# Patient Record
Sex: Male | Born: 1970 | Race: Black or African American | Hispanic: No | Marital: Married | Smoking: Former smoker
Health system: Southern US, Community
[De-identification: ages and names within clinical notes are randomized; demographics above are authoritative.]

## PROBLEM LIST (undated history)

## (undated) DIAGNOSIS — I10 Essential (primary) hypertension: Secondary | ICD-10-CM

## (undated) DIAGNOSIS — E7800 Pure hypercholesterolemia, unspecified: Secondary | ICD-10-CM

---

## 2016-03-12 ENCOUNTER — Emergency Department (HOSPITAL_COMMUNITY)
Admission: EM | Admit: 2016-03-12 | Discharge: 2016-03-12 | Disposition: A | Discharge status: Home / Self Care | Payer: 59 | Attending: Emergency Medicine | Admitting: Emergency Medicine

## 2016-03-12 ENCOUNTER — Emergency Department (HOSPITAL_COMMUNITY): Payer: 59

## 2016-03-12 ENCOUNTER — Other Ambulatory Visit: Payer: Self-pay

## 2016-03-12 ENCOUNTER — Encounter (HOSPITAL_COMMUNITY): Payer: Self-pay

## 2016-03-12 DIAGNOSIS — I2510 Atherosclerotic heart disease of native coronary artery without angina pectoris: Secondary | ICD-10-CM | POA: Diagnosis not present

## 2016-03-12 DIAGNOSIS — Z79899 Other long term (current) drug therapy: Secondary | ICD-10-CM | POA: Diagnosis not present

## 2016-03-12 DIAGNOSIS — R0789 Other chest pain: Secondary | ICD-10-CM

## 2016-03-12 DIAGNOSIS — R079 Chest pain, unspecified: Secondary | ICD-10-CM | POA: Diagnosis present

## 2016-03-12 DIAGNOSIS — I10 Essential (primary) hypertension: Secondary | ICD-10-CM | POA: Insufficient documentation

## 2016-03-12 DIAGNOSIS — I252 Old myocardial infarction: Secondary | ICD-10-CM | POA: Insufficient documentation

## 2016-03-12 LAB — BASIC METABOLIC PANEL
ANION GAP: 6 (ref 5–15)
BUN: 10 mg/dL (ref 6–20)
CO2: 27 mmol/L (ref 22–32)
Calcium: 9.3 mg/dL (ref 8.9–10.3)
Chloride: 106 mmol/L (ref 101–111)
Creatinine, Ser: 0.97 mg/dL (ref 0.61–1.24)
GLUCOSE: 94 mg/dL (ref 65–99)
POTASSIUM: 4.2 mmol/L (ref 3.5–5.1)
Sodium: 139 mmol/L (ref 135–145)

## 2016-03-12 LAB — CBC WITH DIFFERENTIAL/PLATELET
BASOS ABS: 0 10*3/uL (ref 0.0–0.1)
BASOS PCT: 1 %
EOS PCT: 5 %
Eosinophils Absolute: 0.3 10*3/uL (ref 0.0–0.7)
HEMATOCRIT: 40.7 % (ref 39.0–52.0)
Hemoglobin: 13.3 g/dL (ref 13.0–17.0)
LYMPHS PCT: 41 %
Lymphs Abs: 2.3 10*3/uL (ref 0.7–4.0)
MCH: 29.7 pg (ref 26.0–34.0)
MCHC: 32.7 g/dL (ref 30.0–36.0)
MCV: 90.8 fL (ref 78.0–100.0)
Monocytes Absolute: 0.6 10*3/uL (ref 0.1–1.0)
Monocytes Relative: 10 %
NEUTROS ABS: 2.5 10*3/uL (ref 1.7–7.7)
Neutrophils Relative %: 43 %
PLATELETS: 253 10*3/uL (ref 150–400)
RBC: 4.48 MIL/uL (ref 4.22–5.81)
RDW: 14.3 % (ref 11.5–15.5)
WBC: 5.7 10*3/uL (ref 4.0–10.5)

## 2016-03-12 LAB — I-STAT TROPONIN, ED: TROPONIN I, POC: 0.01 ng/mL (ref 0.00–0.08)

## 2016-03-12 NOTE — ED Triage Notes (Signed)
Per PT, Pt is coming from UC with complaints of Left-Sided Chest pain that started on Tuesday and has been intermittent since then. Denies SOB, N/V/D, or any associated symptoms.

## 2016-03-12 NOTE — ED Provider Notes (Signed)
[facility]-EMER[facility]ENCY DEPT Provider Note   CSN: [id number] Arrival date & time: 03/12/16  1441     History   Chief Complaint Chief Complaint  Patient presents with  . Chest Pain    HPI Timothy DieselJermaine Neyman is a 45 y.o. male who presents the emergency department with chief complaint of chest pain. The patient is [place] from [place]. He is currently sleeping on an air mattress because he is here on a short-term work assignment. Patient states that this past Monday he noticed left-sided chest pain which he describes as sharp, intermittent and nonradiating. He states that it "comes and goes." Nothing seems to make it worse or better. However, it doesn't seem to lighten up. When stretching his arm. In extension. This pain is nonexertional, without associated dyspnea, diaphoresis, nausea. Patient states that he noticed it this morning when he woke up, he took his regular blood pressure and cholesterol medications and went back to bed. He woke up and it was gone. But when he went to shop at [employer]. He noticed the pain again and went to an urgent care who sent him here for further evaluation. He has had a slight cough but denies symptoms of URI. He is a smoker, but states that he quit smoking one week ago. He has a history of hypertension, hyperlipidemia. He has family history on his coronary artery disease with triple bypass and a grandfather who died of MI in his early 2280s. The patient has had a previous workup that includes EKGs, stress test, coronary catheterization and is followed by cardiology in his home town. He states that this was 2 years ago and that his entire workup was negative.   HPI  No past medical history on file.  There are no active problems to display for this patient.   No past surgical history on file.     Home Medications    Prior to Admission medications   Not on File    Family History No family history on file.  Social History Social History    Substance Use Topics  . Smoking status: Not on file  . Smokeless tobacco: Not on file  . Alcohol use Not on file     Allergies   Patient has no allergy information on record.   Review of Systems Review of Systems Ten systems reviewed and are negative for acute change, except as noted in the HPI.    Physical Exam Updated Vital Signs BP 126/79 (BP Location: Right Arm)   Pulse 68   Temp 98.1 F (36.7 C) (Oral)   Resp 18   Ht 5\' 11"  (1.803 m)   Wt 110.2 kg   SpO2 100%   BMI 33.89 kg/m   Physical Exam  Constitutional: He is oriented to person, place, and time. He appears well-developed and well-nourished. No distress.  HENT:  Head: Normocephalic and atraumatic.  Eyes: Conjunctivae and EOM are normal. Pupils are equal, round, and reactive to light. No scleral icterus.  Neck: Normal range of motion. Neck supple. No JVD present.  Cardiovascular: Normal rate, regular rhythm and normal heart sounds.  Exam reveals no gallop.   No murmur heard. Pulmonary/Chest: Effort normal and breath sounds normal. No respiratory distress. He exhibits tenderness.  Abdominal: Soft. Bowel sounds are normal. He exhibits no distension. There is no tenderness.  Musculoskeletal: He exhibits no edema.       Arms: Chest wall tenderness present. I externally rotated and extended the patient's left arm. I then placed a  pressure to the pectoralis minor. Patient flinched with pain and states that it re-creates the sharp sensation, states feeling in the chest.  Neurological: He is alert and oriented to person, place, and time.  Skin: Skin is warm and dry. Capillary refill takes less than 2 seconds. He is not diaphoretic.  Psychiatric: His behavior is normal.  Nursing note and vitals reviewed.    ED Treatments / Results  Labs (all labs ordered are listed, but only abnormal results are displayed) Labs Reviewed  CBC WITH DIFFERENTIAL/PLATELET  BASIC METABOLIC PANEL  I-STAT TROPOININ, ED    EKG  EKG  Interpretation  Date/Time:  Thursday March 12 2016 14:39:56 EST Ventricular Rate:  62 PR Interval:  164 QRS Duration: 100 QT Interval:  392 QTC Calculation: 397 R Axis:   91 Text Interpretation:  Normal sinus rhythm Rightward axis Borderline ECG Confirmed by ZAMMIT  MD, JOSEPH [phone]([id number]) on 03/12/2016 3:02:52 PM       Radiology Dg Chest 2 View  Result Date: 03/12/2016 CLINICAL DATA:  Chest pain EXAM: CHEST  2 VIEW COMPARISON:  None. FINDINGS: The heart size and mediastinal contours are within normal limits. Both lungs are clear. The visualized skeletal structures are unremarkable. IMPRESSION: No active cardiopulmonary disease. Electronically Signed   By: Marlan Palauharles  Clark M.D.   On: 03/12/2016 15:19    Procedures Procedures (including critical care time)  Medications Ordered in ED Medications - No data to display   Initial Impression / Assessment and Plan / ED Course  I have reviewed the triage vital signs and the nursing notes.  Pertinent labs & imaging results that were available during my care of the patient were reviewed by me and considered in my medical decision making (see chart for details).  Clinical Course       Final Clinical Impressions(s) / ED Diagnoses   Final diagnoses:  Chest wall pain  To wear, negative troponin, EKG unremarkable for ischemic changes. Recent cardiac workup within the last 2 years without evidence of significant CAD per patient. His pain is reproducible with palpation of the chest wall. He has a heart score of 3, making him low risk for major adverse cardiac events. He is not tachycardic, hypotensive, or hypoxic and I have very low suspicion for pulmonary embolus. Chest x-ray negative. The patient appears safe for discharge at this time. I have given him follow-up with local cardiology. Patient expresses understanding and agrees with plan of care. I discussed reasons to seek immediate medical attention.  New Prescriptions New Prescriptions    No medications on file     Arthor Captainbigail Aloysuis Ribaudo, PA-C 03/12/16 1900    Bethann BerkshireJoseph Zammit, MD 03/16/16 1304

## 2016-03-12 NOTE — Discharge Instructions (Signed)
You have been diagnosed by your caregiver as having chest wall pain. °SEEK IMMEDIATE MEDICAL ATTENTION IF: °You develop a fever.  °Your chest pains become severe or intolerable.  °You develop new, unexplained symptoms (problems).  °You develop shortness of breath, nausea, vomiting, sweating or feel light headed.  °You develop a new cough or you cough up blood. ° °

## 2016-03-12 NOTE — ED Notes (Signed)
Phlebotomy at the bedside  

## 2016-03-12 NOTE — ED Notes (Signed)
Pt placed back in room by Xray

## 2016-03-12 NOTE — ED Notes (Signed)
Pt taken to XRay 

## 2016-04-14 ENCOUNTER — Encounter (HOSPITAL_BASED_OUTPATIENT_CLINIC_OR_DEPARTMENT_OTHER): Payer: Self-pay | Admitting: *Deleted

## 2016-04-14 ENCOUNTER — Emergency Department (HOSPITAL_BASED_OUTPATIENT_CLINIC_OR_DEPARTMENT_OTHER)
Admission: EM | Admit: 2016-04-14 | Discharge: 2016-04-15 | Disposition: A | Discharge status: Home / Self Care | Payer: 59 | Attending: Emergency Medicine | Admitting: Emergency Medicine

## 2016-04-14 DIAGNOSIS — Z79899 Other long term (current) drug therapy: Secondary | ICD-10-CM | POA: Insufficient documentation

## 2016-04-14 DIAGNOSIS — Z7982 Long term (current) use of aspirin: Secondary | ICD-10-CM | POA: Insufficient documentation

## 2016-04-14 DIAGNOSIS — R1030 Lower abdominal pain, unspecified: Secondary | ICD-10-CM | POA: Diagnosis not present

## 2016-04-14 DIAGNOSIS — I10 Essential (primary) hypertension: Secondary | ICD-10-CM | POA: Insufficient documentation

## 2016-04-14 DIAGNOSIS — Z87891 Personal history of nicotine dependence: Secondary | ICD-10-CM | POA: Insufficient documentation

## 2016-04-14 HISTORY — DX: Pure hypercholesterolemia, unspecified: E78.00

## 2016-04-14 HISTORY — DX: Essential (primary) hypertension: I10

## 2016-04-14 NOTE — ED Triage Notes (Signed)
Abdominal pain and dark stools x 2 days. He takes a stool softener but his stools have been hard. He took [employer]pto Bismol.

## 2016-04-15 ENCOUNTER — Emergency Department (HOSPITAL_BASED_OUTPATIENT_CLINIC_OR_DEPARTMENT_OTHER): Payer: 59

## 2016-04-15 LAB — URINALYSIS, ROUTINE W REFLEX MICROSCOPIC
BILIRUBIN URINE: NEGATIVE
Glucose, UA: NEGATIVE mg/dL
Hgb urine dipstick: NEGATIVE
Ketones, ur: NEGATIVE mg/dL
Leukocytes, UA: NEGATIVE
NITRITE: NEGATIVE
Protein, ur: NEGATIVE mg/dL
SPECIFIC GRAVITY, URINE: 1.024 (ref 1.005–1.030)
pH: 6.5 (ref 5.0–8.0)

## 2016-04-15 LAB — CBC WITH DIFFERENTIAL/PLATELET
BASOS PCT: 0 %
Basophils Absolute: 0 10*3/uL (ref 0.0–0.1)
EOS ABS: 0.4 10*3/uL (ref 0.0–0.7)
Eosinophils Relative: 4 %
HCT: 38.8 % — ABNORMAL LOW (ref 39.0–52.0)
HEMOGLOBIN: 12.7 g/dL — AB (ref 13.0–17.0)
LYMPHS ABS: 2.8 10*3/uL (ref 0.7–4.0)
Lymphocytes Relative: 25 %
MCH: 29.3 pg (ref 26.0–34.0)
MCHC: 32.7 g/dL (ref 30.0–36.0)
MCV: 89.4 fL (ref 78.0–100.0)
Monocytes Absolute: 1.1 10*3/uL — ABNORMAL HIGH (ref 0.1–1.0)
Monocytes Relative: 10 %
NEUTROS PCT: 61 %
Neutro Abs: 6.8 10*3/uL (ref 1.7–7.7)
Platelets: 280 10*3/uL (ref 150–400)
RBC: 4.34 MIL/uL (ref 4.22–5.81)
RDW: 14.1 % (ref 11.5–15.5)
WBC: 11.2 10*3/uL — AB (ref 4.0–10.5)

## 2016-04-15 LAB — COMPREHENSIVE METABOLIC PANEL
ALBUMIN: 4.5 g/dL (ref 3.5–5.0)
ALT: 46 U/L (ref 17–63)
AST: 38 U/L (ref 15–41)
Alkaline Phosphatase: 59 U/L (ref 38–126)
Anion gap: 9 (ref 5–15)
BUN: 14 mg/dL (ref 6–20)
CALCIUM: 9.4 mg/dL (ref 8.9–10.3)
CO2: 25 mmol/L (ref 22–32)
CREATININE: 0.82 mg/dL (ref 0.61–1.24)
Chloride: 106 mmol/L (ref 101–111)
GFR calc Af Amer: >60 mL/min (ref >60)
GFR calc non Af Amer: >60 mL/min (ref >60)
GLUCOSE: 120 mg/dL — AB (ref 65–99)
Potassium: 3.6 mmol/L (ref 3.5–5.1)
Sodium: 140 mmol/L (ref 135–145)
Total Bilirubin: 0.2 mg/dL — ABNORMAL LOW (ref 0.3–1.2)
Total Protein: 7.7 g/dL (ref 6.5–8.1)

## 2016-04-15 LAB — LIPASE, BLOOD: Lipase: 20 U/L (ref 11–51)

## 2016-04-15 LAB — OCCULT BLOOD X 1 CARD TO LAB, STOOL: Fecal Occult Bld: NEGATIVE

## 2016-04-15 MED ORDER — IOPAMIDOL (ISOVUE-300) INJECTION 61%
100.0000 mL | Freq: Once | INTRAVENOUS | Status: AC | PRN
  Administered 2016-04-15: 100 mL via INTRAVENOUS

## 2016-04-15 NOTE — ED Notes (Signed)
Pt denies pain at this time
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
## 2016-04-15 NOTE — ED Notes (Signed)
Patient transported to CT 

## 2018-03-09 IMAGING — DX DG CHEST 2V
2 series · 2 of 2 positions shown · non-contrast
Comparison: None.

CLINICAL DATA: Chest pain

EXAM:
CHEST  2 VIEW

[chest pa]
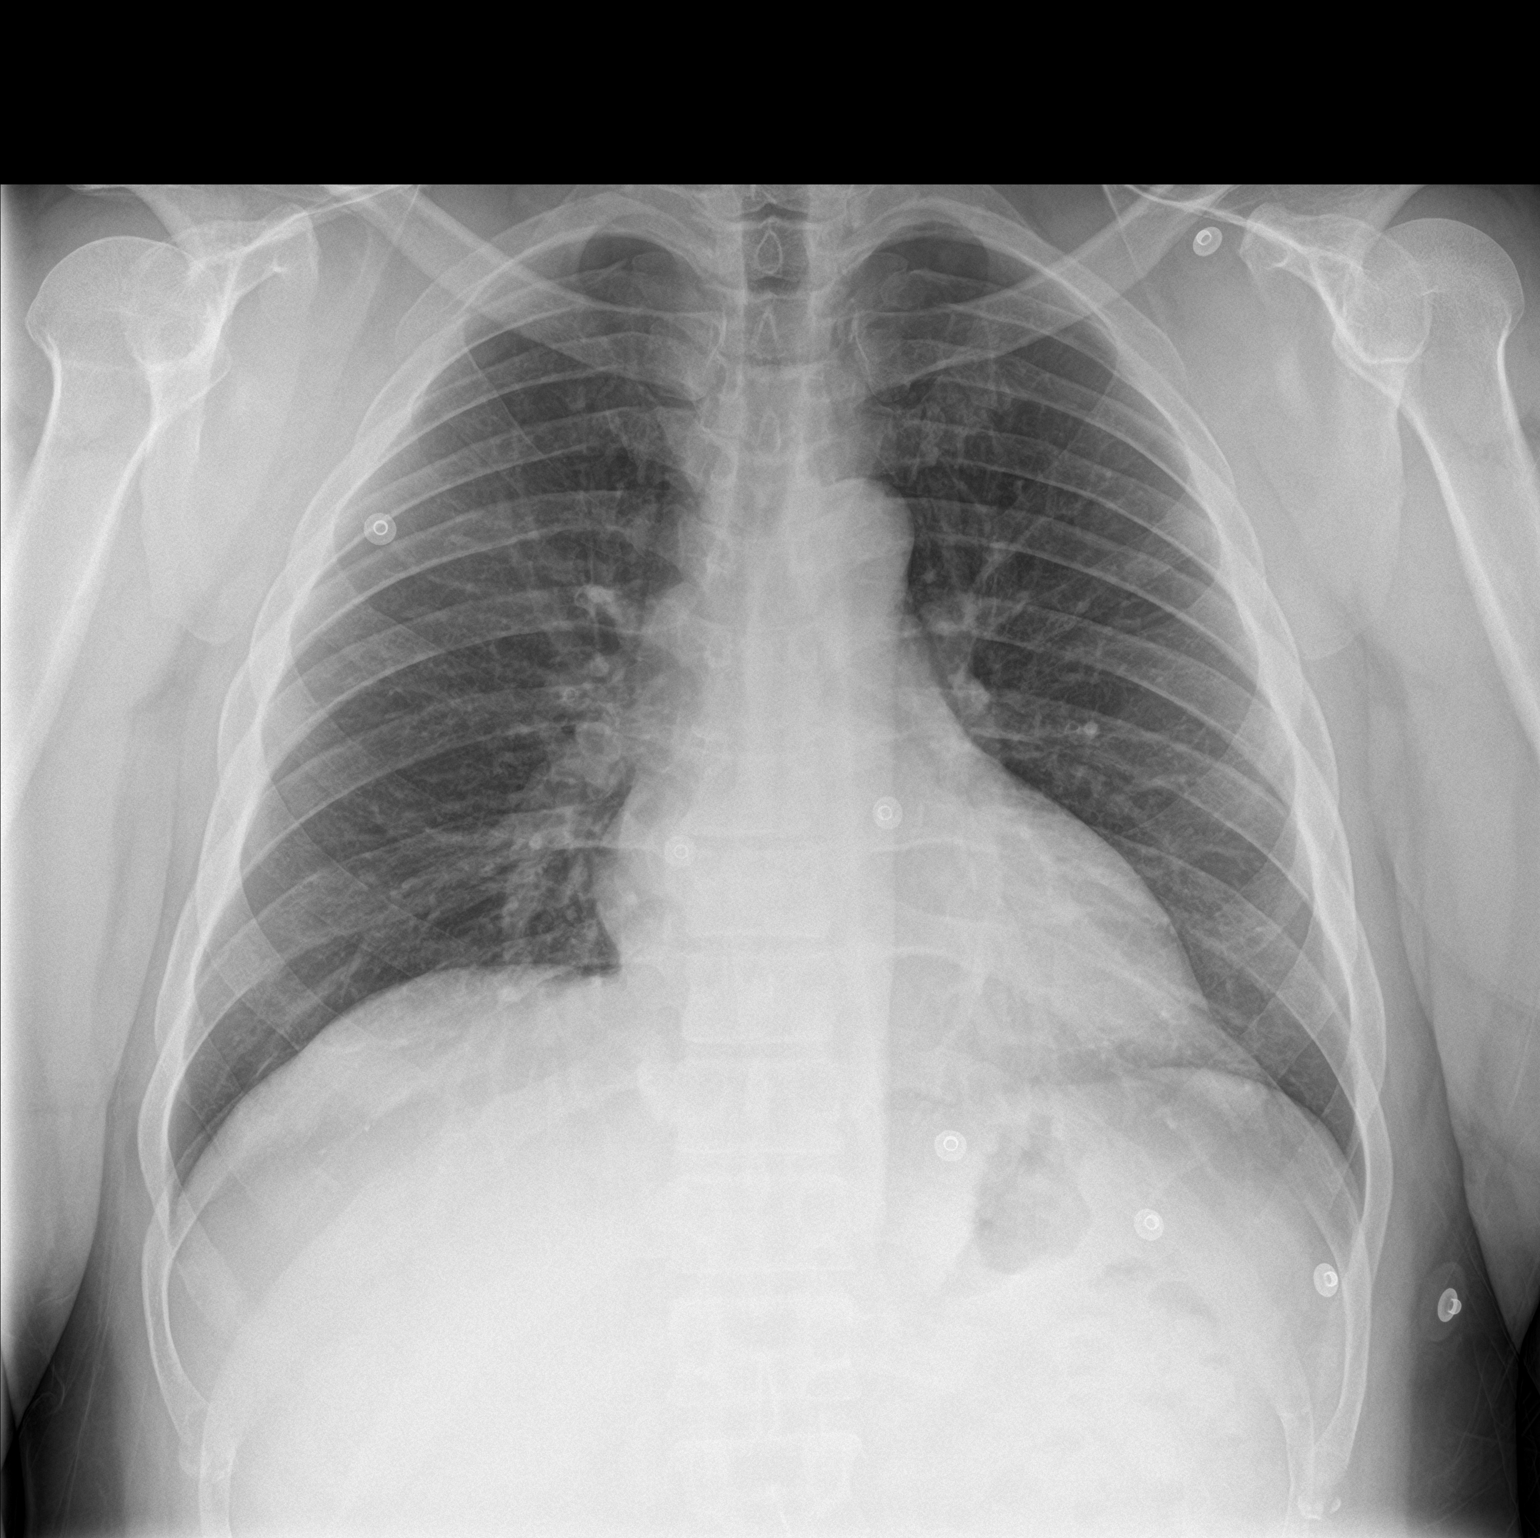

[chest lat]
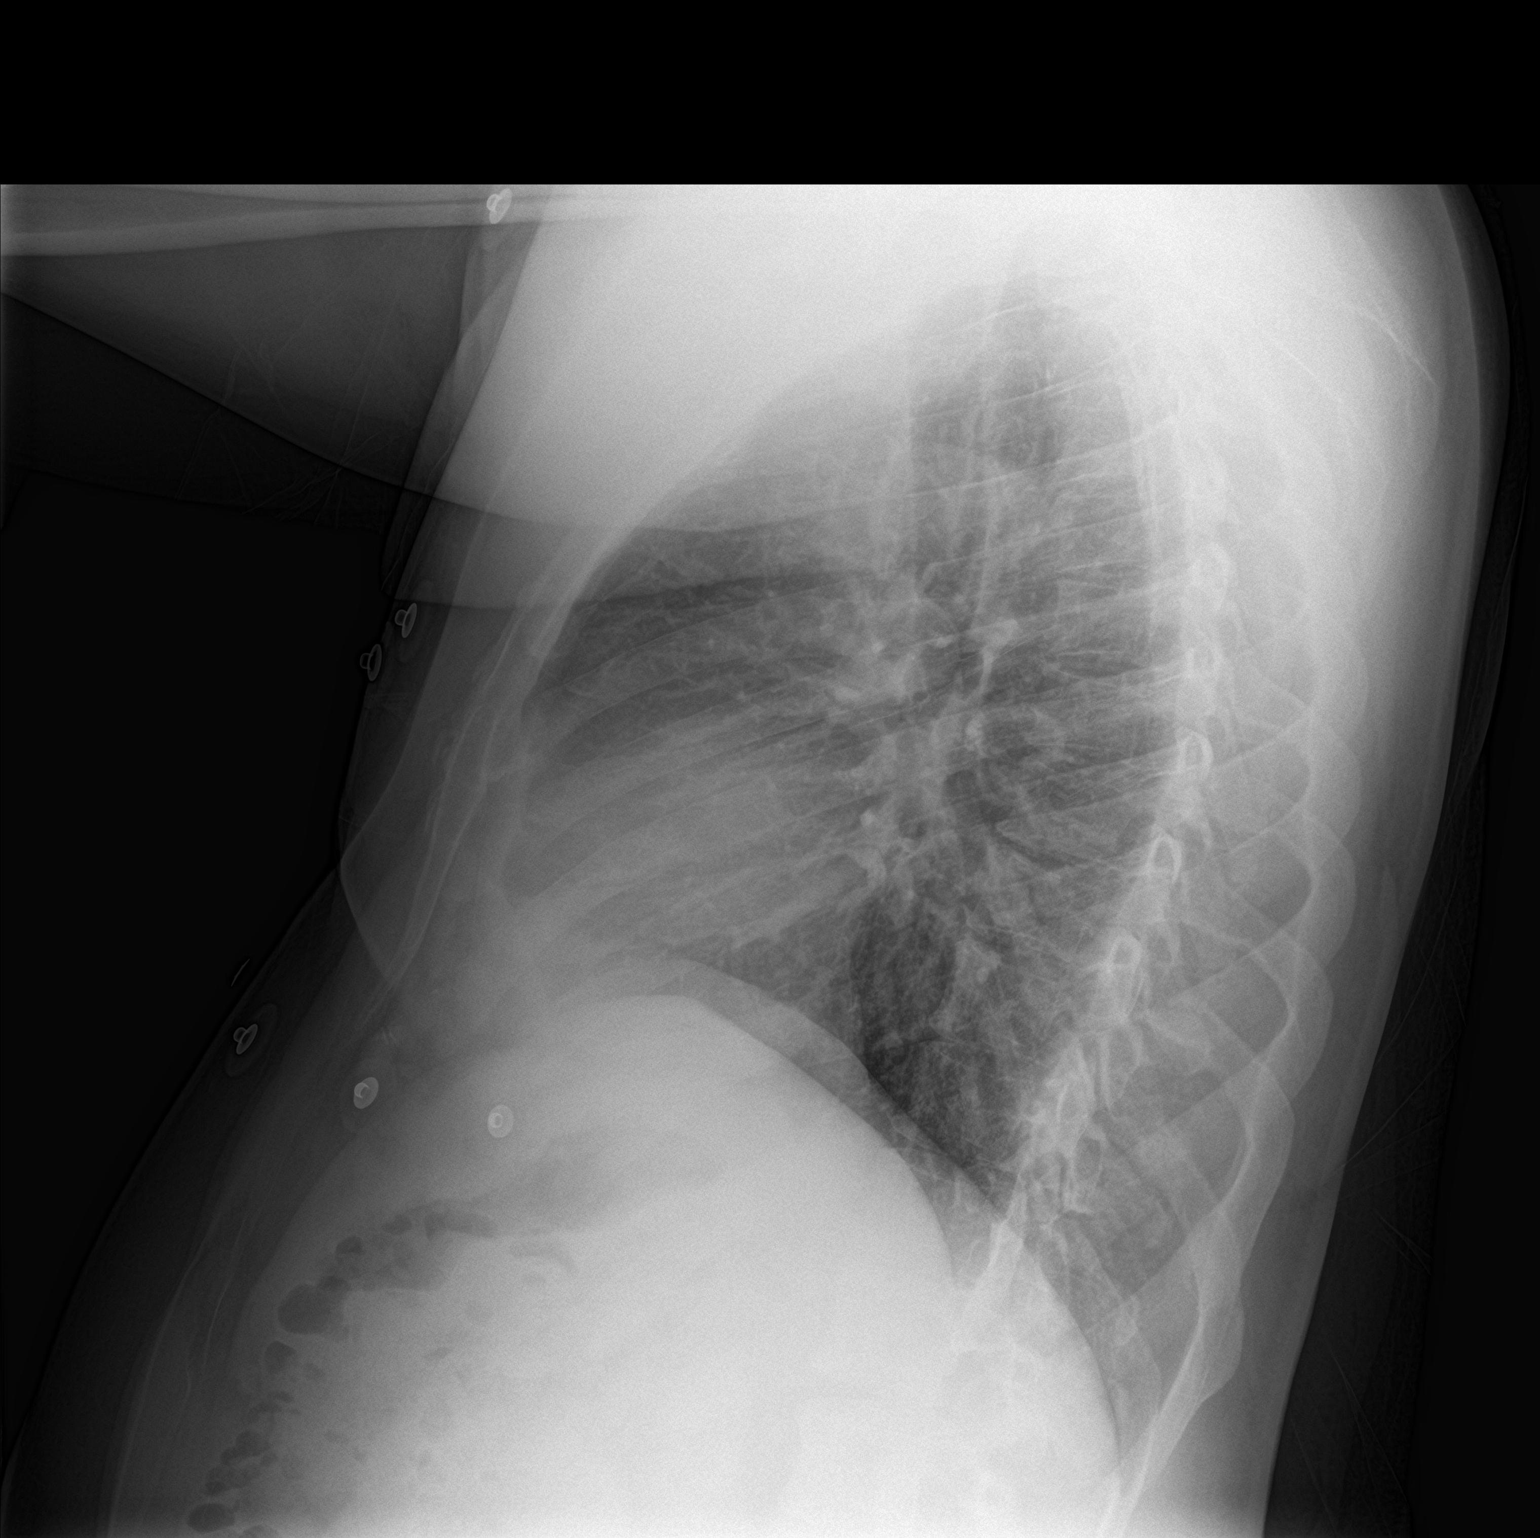

[2 of 2 positions shown; findings below may reference images not displayed]

FINDINGS: The heart size and mediastinal contours are within normal limits.
Both lungs are clear. The visualized skeletal structures are
unremarkable.
IMPRESSION: No active cardiopulmonary disease.
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
